# Patient Record
Sex: Male | Born: 1950 | Race: Black or African American | Hispanic: No | Marital: Married | Smoking: Current some day smoker
Health system: Southern US, Community
[De-identification: ages and names within clinical notes are randomized; demographics above are authoritative.]

## PROBLEM LIST (undated history)

## (undated) DIAGNOSIS — E7800 Pure hypercholesterolemia, unspecified: Secondary | ICD-10-CM

## (undated) DIAGNOSIS — I10 Essential (primary) hypertension: Secondary | ICD-10-CM

---

## 2018-09-15 ENCOUNTER — Ambulatory Visit
Admission: EM | Admit: 2018-09-15 | Discharge: 2018-09-15 | Disposition: A | Discharge status: Home / Self Care | Payer: Medicare Other | Attending: Family Medicine | Admitting: Family Medicine

## 2018-09-15 ENCOUNTER — Ambulatory Visit (INDEPENDENT_AMBULATORY_CARE_PROVIDER_SITE_OTHER): Payer: Medicare Other

## 2018-09-15 ENCOUNTER — Other Ambulatory Visit: Payer: Self-pay

## 2018-09-15 ENCOUNTER — Encounter: Payer: Self-pay | Admitting: Emergency Medicine

## 2018-09-15 DIAGNOSIS — S161XXA Strain of muscle, fascia and tendon at neck level, initial encounter: Secondary | ICD-10-CM

## 2018-09-15 DIAGNOSIS — M545 Low back pain, unspecified: Secondary | ICD-10-CM

## 2018-09-15 DIAGNOSIS — M546 Pain in thoracic spine: Secondary | ICD-10-CM

## 2018-09-15 DIAGNOSIS — M25512 Pain in left shoulder: Secondary | ICD-10-CM | POA: Diagnosis not present

## 2018-09-15 DIAGNOSIS — M542 Cervicalgia: Secondary | ICD-10-CM | POA: Diagnosis not present

## 2018-09-15 HISTORY — DX: Pure hypercholesterolemia, unspecified: E78.00

## 2018-09-15 HISTORY — DX: Essential (primary) hypertension: I10

## 2018-09-15 MED ORDER — HYDROCODONE-ACETAMINOPHEN 5-325 MG PO TABS: ORAL_TABLET | ORAL | 0 refills | Status: AC

## 2018-09-15 MED ORDER — CYCLOBENZAPRINE HCL 10 MG PO TABS: 10.0000 mg | ORAL_TABLET | Freq: Every day | ORAL | 0 refills | Status: AC
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
## 2018-09-15 NOTE — Discharge Instructions (Addendum)
Rest, ice/heat 
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
